# Patient Record
Sex: Female | Born: 2005 | Race: Black or African American | Hispanic: No | Marital: Single | Smoking: Never smoker
Health system: Southern US, Community
[De-identification: ages and names within clinical notes are randomized; demographics above are authoritative.]

## PROBLEM LIST (undated history)

## (undated) DIAGNOSIS — J45909 Unspecified asthma, uncomplicated: Secondary | ICD-10-CM

## (undated) DIAGNOSIS — L309 Dermatitis, unspecified: Secondary | ICD-10-CM

## (undated) HISTORY — DX: Dermatitis, unspecified: L30.9

## (undated) HISTORY — DX: Unspecified asthma, uncomplicated: J45.909

---
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
## 2023-01-14 ENCOUNTER — Other Ambulatory Visit: Payer: BC Managed Care – PPO

## 2023-01-14 DIAGNOSIS — E538 Deficiency of other specified B group vitamins: Secondary | ICD-10-CM

## 2023-01-14 MED ORDER — CYANOCOBALAMIN 1000 MCG/ML IJ SOLN
1000.0000 ug | Freq: Once | INTRAMUSCULAR | Status: AC
Start: 2023-01-14 — End: ?

## 2023-01-29 ENCOUNTER — Other Ambulatory Visit: Payer: Self-pay | Admitting: Allergy & Immunology

## 2023-02-11 ENCOUNTER — Other Ambulatory Visit (INDEPENDENT_AMBULATORY_CARE_PROVIDER_SITE_OTHER): Payer: BC Managed Care – PPO

## 2023-02-11 ENCOUNTER — Ambulatory Visit: Payer: BC Managed Care – PPO | Admitting: Allergy & Immunology

## 2023-02-11 DIAGNOSIS — E538 Deficiency of other specified B group vitamins: Secondary | ICD-10-CM | POA: Diagnosis not present

## 2023-02-11 MED ORDER — CYANOCOBALAMIN 1000 MCG/ML IJ SOLN
1000.0000 ug | Freq: Once | INTRAMUSCULAR | Status: AC
Start: 2023-02-11 — End: 2023-02-11
  Administered 2023-02-11: 1000 ug via INTRAMUSCULAR

## 2023-02-17 ENCOUNTER — Other Ambulatory Visit: Payer: Self-pay

## 2023-02-17 ENCOUNTER — Ambulatory Visit (INDEPENDENT_AMBULATORY_CARE_PROVIDER_SITE_OTHER): Payer: BC Managed Care – PPO | Admitting: Family Medicine

## 2023-02-17 ENCOUNTER — Encounter: Payer: Self-pay | Admitting: Family Medicine

## 2023-02-17 VITALS — BP 102/60 | HR 90 | Temp 98.2°F | Wt 255.0 lb

## 2023-02-17 DIAGNOSIS — K219 Gastro-esophageal reflux disease without esophagitis: Secondary | ICD-10-CM

## 2023-02-17 DIAGNOSIS — J3089 Other allergic rhinitis: Secondary | ICD-10-CM

## 2023-02-17 DIAGNOSIS — T7802XD Anaphylactic reaction due to shellfish (crustaceans), subsequent encounter: Secondary | ICD-10-CM | POA: Diagnosis not present

## 2023-02-17 DIAGNOSIS — T783XXD Angioneurotic edema, subsequent encounter: Secondary | ICD-10-CM

## 2023-02-17 DIAGNOSIS — J4540 Moderate persistent asthma, uncomplicated: Secondary | ICD-10-CM | POA: Diagnosis not present

## 2023-02-17 DIAGNOSIS — T783XXA Angioneurotic edema, initial encounter: Secondary | ICD-10-CM

## 2023-02-17 DIAGNOSIS — J302 Other seasonal allergic rhinitis: Secondary | ICD-10-CM

## 2023-02-17 MED ORDER — BUDESONIDE-FORMOTEROL FUMARATE 160-4.5 MCG/ACT IN AERO: INHALATION_SPRAY | RESPIRATORY_TRACT | 5 refills | Status: DC

## 2023-02-17 MED ORDER — FLUTICASONE PROPIONATE 50 MCG/ACT NA SUSP: 1.0000 | Freq: Every day | NASAL | 5 refills | Status: DC

## 2023-02-17 NOTE — Patient Instructions (Addendum)
Asthma Restart Symbicort 160-2 puffs once a day with a spacer to prevent cough or wheeze You may use albuterol 2 puffs once every 4 hours as needed for cough or wheeze Continue albuterol 2 puffs 5 to 15 minutes before activity to decrease cough or wheeze For asthma flare increase Symbicort 160 to 2 puffs twice a day for 2 weeks or until cough and wheeze free, then return to original dosing  Allergic rhinitis Continue allergen avoidance measures directed toward tree pollen, weed pollen, grass pollen, mold, dust mite, cat, and cockroach as listed below Continue cetirizine 10 mg once a day as needed for runny nose or itch Continue Flonase 2 sprays in each nostril once a day as needed for stuffy nose Consider saline nasal rinses as needed for nasal symptoms. Use this before any medicated nasal sprays for best result Consider allergen immunotherapy if your symptoms are not well-controlled with the treatment plan as listed below  Chronic urticaria/angioedema Continue cetirizine once or twice a day as needed for hives or itch If your symptoms re-occur, begin a journal of events that occurred for up to 6 hours before your symptoms began including foods and beverages consumed, soaps or perfumes you had contact with, and medications.   Reflux Continue dietary and lifestyle modifications as listed below  Food allergy Continue to avoid all shellfish. In case of an allergic reaction, take Benadryl 50 mg every 4 hours, and if life-threatening symptoms occur, inject with EpiPen 0.3 mg.  Call the clinic if this treatment plan is not working well for you  Follow up in 6 months or sooner if needed.  Reducing Pollen Exposure The American Academy of Allergy, Asthma and Immunology suggests the following steps to reduce your exposure to pollen during allergy seasons. Do not hang sheets or clothing out to dry; pollen may collect on these items. Do not mow lawns or spend time around freshly cut grass; mowing  stirs up pollen. Keep windows closed at night.  Keep car windows closed while driving. Minimize morning activities outdoors, a time when pollen counts are usually at their highest. Stay indoors as much as possible when pollen counts or humidity is high and on windy days when pollen tends to remain in the air longer. Use air conditioning when possible.  Many air conditioners have filters that trap the pollen spores. Use a HEPA room air filter to remove pollen form the indoor air you breathe.  Control of Mold Allergen Mold and fungi can grow on a variety of surfaces provided certain temperature and moisture conditions exist.  Outdoor molds grow on plants, decaying vegetation and soil.  The major outdoor mold, Alternaria and Cladosporium, are found in very high numbers during hot and dry conditions.  Generally, a late Summer - Fall peak is seen for common outdoor fungal spores.  Rain will temporarily lower outdoor mold spore count, but counts rise rapidly when the rainy period ends.  The most important indoor molds are Aspergillus and Penicillium.  Dark, humid and poorly ventilated basements are ideal sites for mold growth.  The next most common sites of mold growth are the bathroom and the kitchen.  Outdoor [employer] Use air conditioning and keep windows closed Avoid exposure to decaying vegetation. Avoid leaf raking. Avoid grain handling. Consider wearing a face mask if working in moldy areas.  Indoor Mold Control Maintain humidity below 50%. Clean washable surfaces with 5% bleach solution. Remove sources e.g. Contaminated carpets.  Control of Dog or Cat Allergen Avoidance is the  best way to manage a dog or cat allergy. If you have a dog or cat and are allergic to dog or cats, consider removing the dog or cat from the home. If you have a dog or cat but don't want to find it a new home, or if your family wants a pet even though someone in the household is allergic, here are some strategies  that may help keep symptoms at bay:  Keep the pet out of your bedroom and restrict it to only a few rooms. Be advised that keeping the dog or cat in only one room will not limit the allergens to that room. Don't pet, hug or kiss the dog or cat; if you do, wash your hands with soap and water. High-efficiency particulate air (HEPA) cleaners run continuously in a bedroom or living room can reduce allergen levels over time. Regular use of a high-efficiency vacuum cleaner or a central vacuum can reduce allergen levels. Giving your dog or cat a bath at least once a week can reduce airborne allergen.  Control of Cockroach Allergen Cockroach allergen has been identified as an important cause of acute attacks of asthma, especially in urban settings.  There are fifty-five species of cockroach that exist in the [place], however only three, the [place], [place] species produce allergen that can affect patients with Asthma.  Allergens can be obtained from fecal particles, egg casings and secretions from cockroaches.    Remove food sources. Reduce access to water. Seal access and entry points. Spray runways with 0.5-1% Diazinon or Chlorpyrifos Blow boric acid power under stoves and refrigerator. Place bait stations (hydramethylnon) at feeding sites.    Control of Dust Mite Allergen Dust mites play a major role in allergic asthma and rhinitis. They occur in environments with high humidity wherever human skin is found. Dust mites absorb humidity from the atmosphere (ie, they do not drink) and feed on organic matter (including shed human and animal skin). Dust mites are a microscopic type of insect that you cannot see with the naked eye. High levels of dust mites have been detected from mattresses, pillows, carpets, upholstered furniture, bed covers, clothes, soft toys and any woven material. The principal allergen of the dust mite is found in its feces. A gram of dust may contain 1,000 mites  and 250,000 fecal particles. Mite antigen is easily measured in the air during house cleaning activities. Dust mites do not bite and do not cause harm to humans, other than by triggering allergies/asthma.  Ways to decrease your exposure to dust mites in your home:  1. Encase mattresses, box springs and pillows with a mite-impermeable barrier or cover  2. Wash sheets, blankets and drapes weekly in hot water (130 F) with detergent and dry them in a dryer on the hot setting.  3. Have the room cleaned frequently with a vacuum cleaner and a damp dust-mop. For carpeting or rugs, vacuuming with a vacuum cleaner equipped with a high-efficiency particulate air (HEPA) filter. The dust mite allergic individual should not be in a room which is being cleaned and should wait 1 hour after cleaning before going into the room.  4. Do not sleep on upholstered furniture (eg, couches).  5. If possible removing carpeting, upholstered furniture and drapery from the home is ideal. Horizontal blinds should be eliminated in the rooms where the person spends the most time (bedroom, study, television room). Washable vinyl, roller-type shades are optimal.  6. Remove all non-washable stuffed toys from the  bedroom. Wash stuffed toys weekly like sheets and blankets above.  7. Reduce indoor humidity to less than 50%. Inexpensive humidity monitors can be purchased at most hardware stores. Do not use a humidifier as can make the problem worse and are not recommended.

## 2023-02-17 NOTE — Progress Notes (Signed)
[address] [place] [address] Dept: [phone]  FOLLOW UP NOTE  Patient ID: Dana Mcbride, female    DOB: [birth year]  Age: 17 y.o. MRN: [id number] Date of Office Visit: 02/17/2023  Assessment  Chief Complaint: Follow-up  HPI Dana Mcbride is a 17 year old female who presents to the clinic for a follow-up visit.  She was last seen in this clinic on 10/29/2022 by Dr. Dellis Anes for evaluation of asthma, allergic rhinitis, reflux, urticaria, and food allergy to shellfish.  She is accompanied by her mother who assists with history.  At today's visit, she reports her asthma has been moderately well-controlled with infrequent dry cough has been main symptom.  She reports that she has been taking Symbicort 160-2 puffs once a day and using albuterol infrequently, however, she has recently run out of Symbicort 160 and is using albuterol more frequently.  She has not taken montelukast over the last 4 months.  She reports montelukast does not provide relief from symptoms of asthma or allergy and is not interested in restarting montelukast at this time.  Allergic rhinitis is reported as moderately well-controlled with nasal symptoms including clear rhinorrhea and postnasal drainage as the main symptoms.  She continues Flonase infrequently, cetirizine as needed, and is not currently using saline nasal rinses. Her last environmental allergy skin testing was on 10/13/2017 and was positive to grass pollen, weed pollen, ragweed pollen, tree pollen, mold, cat, dog, dust mite, and cockroach.    Reflux is reported as well-controlled with no symptoms including heartburn or vomiting.  She is not currently taking a medication to control reflux.  She has previously taken Protonix, however, reports that she has not taken this medication over the last several months with no return of reflux symptoms.  She reports that she has not experienced any lip swelling since her last visit to this clinic.  She continues to  avoid shellfish with no accidental ingestion or EpiPen use since her last visit to this clinic.  Her last food allergy testing via lab was positive to shellfish on 09/12/2022.  EpiPen set is up-to-date.  Her current medications are listed in the chart.  Drug Allergies:  Allergies  Allergen Reactions   Other Hives   Shellfish Allergy Hives   Shellfish-Derived Products     Physical Exam: BP 102/60   Pulse 90   Temp 98.2 F (36.8 C) (Temporal)   Wt 255 lb (115.7 kg)   SpO2 97%   BMI 39.56 kg/m    Physical Exam Vitals reviewed.  Constitutional:      Appearance: Normal appearance.  HENT:     Head: Normocephalic and atraumatic.     Right Ear: Tympanic membrane normal.     Left Ear: Tympanic membrane normal.     Nose:     Comments: Bilateral nares slightly erythematous with thin clear nasal drainage noted.  Pharynx normal.  Ears normal.  Eyes normal.    Mouth/Throat:     Pharynx: Oropharynx is clear.  Eyes:     Conjunctiva/sclera: Conjunctivae normal.  Cardiovascular:     Rate and Rhythm: Normal rate and regular rhythm.     Heart sounds: Normal heart sounds. No murmur heard. Pulmonary:     Effort: Pulmonary effort is normal.     Breath sounds: Normal breath sounds.     Comments: Lungs clear to auscultation Musculoskeletal:        General: Normal range of motion.     Cervical back: Normal range of motion and neck supple.  Skin:    General: Skin is warm and dry.  Neurological:     Mental Status: She is alert and oriented to person, place, and time.  Psychiatric:        Mood and Affect: Mood normal.        Behavior: Behavior normal.        Thought Content: Thought content normal.        Judgment: Judgment normal.     Diagnostics: FVC 3.36 which is 93% of predicted value, FEV1 2.80 which is 88% of predicted value.  Spirometry indicates normal ventilatory function.  Assessment and Plan: 1. Moderate persistent asthma, uncomplicated   2. Seasonal and perennial allergic  rhinitis   3. Gastroesophageal reflux disease, unspecified whether esophagitis present   4. Anaphylactic shock due to shellfish, subsequent encounter   5. Angioedema, initial encounter     Meds ordered this encounter  Medications   fluticasone (FLONASE) 50 MCG/ACT nasal spray    Sig: Place 1 spray into both nostrils daily.    Dispense:  16 mL    Refill:  5   budesonide-formoterol (SYMBICORT) 160-4.5 MCG/ACT inhaler    Sig: INHALE 2 PUFFS INTO THE LUNGS TWICE A DAY    Dispense:  10.2 each    Refill:  5    Patient Instructions  Asthma Restart Symbicort 160-2 puffs once a day with a spacer to prevent cough or wheeze You may use albuterol 2 puffs once every 4 hours as needed for cough or wheeze Continue albuterol 2 puffs 5 to 15 minutes before activity to decrease cough or wheeze For asthma flare increase Symbicort 160 to 2 puffs twice a day for 2 weeks or until cough and wheeze free, then return to original dosing  Allergic rhinitis Continue allergen avoidance measures directed toward tree pollen, weed pollen, grass pollen, mold, dust mite, cat, and cockroach as listed below Continue cetirizine 10 mg once a day as needed for runny nose or itch Continue Flonase 2 sprays in each nostril once a day as needed for stuffy nose Consider saline nasal rinses as needed for nasal symptoms. Use this before any medicated nasal sprays for best result Consider allergen immunotherapy if your symptoms are not well-controlled with the treatment plan as listed below  Chronic urticaria/angioedema Continue cetirizine once or twice a day as needed for hives or itch If your symptoms re-occur, begin a journal of events that occurred for up to 6 hours before your symptoms began including foods and beverages consumed, soaps or perfumes you had contact with, and medications.   Reflux Continue dietary and lifestyle modifications as listed below  Food allergy Continue to avoid all shellfish. In case of an  allergic reaction, take Benadryl 50 mg every 4 hours, and if life-threatening symptoms occur, inject with EpiPen 0.3 mg.  Call the clinic if this treatment plan is not working well for you  Follow up in 6 months or sooner if needed.   Return in about 6 months (around 08/18/2023), or if symptoms worsen or fail to improve.    Thank you for the opportunity to care for this patient.  Please do not hesitate to contact me with questions.  Thermon Leyland, FNP [facility]

## 2023-03-04 ENCOUNTER — Other Ambulatory Visit (INDEPENDENT_AMBULATORY_CARE_PROVIDER_SITE_OTHER): Payer: BC Managed Care – PPO

## 2023-03-04 ENCOUNTER — Encounter: Payer: Self-pay | Admitting: Nurse Practitioner

## 2023-03-04 ENCOUNTER — Ambulatory Visit: Payer: BC Managed Care – PPO | Admitting: Nurse Practitioner

## 2023-03-04 VITALS — BP 124/78 | HR 96 | Temp 98.1°F | Wt 251.4 lb

## 2023-03-04 DIAGNOSIS — R110 Nausea: Secondary | ICD-10-CM | POA: Diagnosis not present

## 2023-03-04 DIAGNOSIS — J029 Acute pharyngitis, unspecified: Secondary | ICD-10-CM | POA: Diagnosis not present

## 2023-03-04 DIAGNOSIS — R197 Diarrhea, unspecified: Secondary | ICD-10-CM

## 2023-03-04 DIAGNOSIS — R059 Cough, unspecified: Secondary | ICD-10-CM

## 2023-03-04 LAB — POCT RAPID STREP A (OFFICE): Rapid Strep A Screen: NEGATIVE

## 2023-03-04 LAB — POC COVID19 BINAXNOW: SARS Coronavirus 2 Ag: NEGATIVE

## 2023-03-04 LAB — POCT INFLUENZA A/B
Influenza A, POC: NEGATIVE
Influenza B, POC: NEGATIVE

## 2023-03-04 MED ORDER — FLUCONAZOLE 150 MG PO TABS
ORAL_TABLET | ORAL | 2 refills | Status: DC
Start: 2023-03-04 — End: 2023-12-30

## 2023-03-04 MED ORDER — LIDOCAINE VISCOUS HCL 2 % MT SOLN
5.0000 mL | OROMUCOSAL | 0 refills | Status: DC | PRN
Start: 2023-03-04 — End: 2023-12-30

## 2023-03-04 MED ORDER — AMOXICILLIN-POT CLAVULANATE 875-125 MG PO TABS
1.0000 | ORAL_TABLET | Freq: Two times a day (BID) | ORAL | 0 refills | Status: DC
Start: 2023-03-04 — End: 2023-12-30

## 2023-03-04 MED ORDER — ONDANSETRON 4 MG PO TBDP
4.0000 mg | ORAL_TABLET | Freq: Three times a day (TID) | ORAL | 0 refills | Status: AC | PRN
Start: 2023-03-04 — End: ?

## 2023-03-04 NOTE — Assessment & Plan Note (Signed)
 Acute onset of sore throat, headache, nausea, diarrhea, and cough starting on Sunday. Throat swab for strep was negative, but symptoms are consistent with streptococcal pharyngitis. Suspected Dana Mcbride testing may not detect bacteria. Throat pain rated 8/10. No sinus pain, pressure, rhinorrhea, or otalgia. No vomiting. Able to drink but eating is difficult due to throat pain. No myalgia but has headaches. No chills or sweats. Allergic to shellfish and egg. Discussed rapid symptom improvement with antibiotics if strep. Advised rest and hydration. Explained soothing options: lozenges, sprays, warm tea, honey. Discussed alternating acetaminophen and ibuprofen  for pain, swelling, and fever. Informed about potential post-antibiotic yeast infections and offered prescription if needed. - Prescribe Augmentin : take one immediately, another before bed tonight, then one in the morning and one at bedtime starting tomorrow for five days - Prescribe lidocaine  gargle for throat pain - Prescribe Zofran  (dissolvable) for nausea - Recommend over-the-counter lozenges, sprays, warm tea, and honey for throat soothing - Recommend alternating acetaminophen and ibuprofen  every four hours for pain, swelling, and fever - Advise rest and hydration - Provide work note covering from yesterday through Thursday - Instruct to contact if not improved by end of antibiotic course

## 2023-03-04 NOTE — Patient Instructions (Addendum)
 I am going to treat you today for strep throat. Your symptoms are VERY consistent with strep and I am concerned that it may be too Verla Bryngelson for the in office test to pick it up. I don't want you to have to wait several days for treatment by sending out a swab.  I have sent in Augmentin . Take one immediately when you get it and the second before you go to bed tonight.  Starting tomorrow, take one in the morning and one at bedtime.   I have sent in a lidocaine  swish for you to gargle with. This can help with the sore throat. You can also use over the counter lozenges or sprays to help. You can also use warm tea and honey to help.   I have sent in ondansetron  to help with the nausea. This will dissolve in the mouth and can be used for nausea.   Be sure you are drinking plenty of fluids and rest as much as possible.   Alternate Tylenol 1000mg  and Ibuprofen  600-800mg  every 4 hours for the next 1-2 days to help with the inflammation, pain, and fever.

## 2023-03-04 NOTE — Progress Notes (Signed)
 Dana Mcbride Doing, DNP, AGNP-c [facility] [address] [place], [place] [address] [phone]   ACUTE VISIT- ESTABLISHED PATIENT  Blood pressure 124/78, pulse 96, temperature 98.1 F (36.7 C), weight 251 lb 6.4 oz (114 kg), SpO2 98%.  Subjective:  HPI Dana Mcbride is a 17 y.o. female presents to day for evaluation of acute concern(s).   Dana Mcbride presented with a constellation of symptoms that began on Sunday. She reported a sore throat, headache, nausea, diarrhea, and cough. The patient denied experiencing sinus pain, pressure in the face, runny nose, or ear pain. Despite a negative strep test, the patient's symptoms were consistent with strep throat, which she had not previously experienced.  The patient reported a throat pain intensity of 6 on a scale of 0 to 10, with 10 being the worst pain imaginable. She also reported headaches but denied any body aches. The patient had not vomited but was experiencing difficulty eating due to throat pain, managing only to consume a few crackers and fluids.  The patient had not worked since the day before the consultation due to her symptoms. She had attempted to schedule an appointment on that day but was unable to do so. The patient was scheduled to work the following day and then had a day off before working again on the weekend.  ROS negative except for what is listed in HPI. History, Medications, Surgery, SDOH, and Family History reviewed and updated as appropriate.  Objective:  Physical Exam Vitals and nursing note reviewed.  Constitutional:      Appearance: She is ill-appearing.  HENT:     Head: Normocephalic.     Nose: Nose normal.     Mouth/Throat:     Pharynx: Uvula midline. Posterior oropharyngeal erythema present.     Tonsils: Tonsillar exudate present. 2+ on the right. 2+ on the left.  Eyes:     Conjunctiva/sclera: Conjunctivae normal.  Cardiovascular:     Rate and Rhythm: Normal rate and regular rhythm.   Pulmonary:     Effort: Pulmonary effort is normal.     Breath sounds: Normal breath sounds.  Lymphadenopathy:     Cervical: Cervical adenopathy present.  Neurological:     Mental Status: She is alert.          Assessment & Plan:   Problem List Items Addressed This Visit     Acute sore throat - Primary   Acute onset of sore throat, headache, nausea, diarrhea, and cough starting on Sunday. Throat swab for strep was negative, but symptoms are consistent with streptococcal pharyngitis. Suspected Dana Mcbride testing may not detect bacteria. Throat pain rated 8/10. No sinus pain, pressure, rhinorrhea, or otalgia. No vomiting. Able to drink but eating is difficult due to throat pain. No myalgia but has headaches. No chills or sweats. Allergic to shellfish and egg. Discussed rapid symptom improvement with antibiotics if strep. Advised rest and hydration. Explained soothing options: lozenges, sprays, warm tea, honey. Discussed alternating acetaminophen and ibuprofen  for pain, swelling, and fever. Informed about potential post-antibiotic yeast infections and offered prescription if needed. - Prescribe Augmentin : take one immediately, another before bed tonight, then one in the morning and one at bedtime starting tomorrow for five days - Prescribe lidocaine  gargle for throat pain - Prescribe Zofran  (dissolvable) for nausea - Recommend over-the-counter lozenges, sprays, warm tea, and honey for throat soothing - Recommend alternating acetaminophen and ibuprofen  every four hours for pain, swelling, and fever - Advise rest and hydration - Provide work note covering from yesterday through  Thursday - Instruct to contact if not improved by end of antibiotic course      Relevant Medications   amoxicillin -clavulanate (AUGMENTIN ) 875-125 MG tablet   lidocaine  (XYLOCAINE ) 2 % solution   fluconazole  (DIFLUCAN ) 150 MG tablet   Nausea   Relevant Medications   ondansetron  (ZOFRAN -ODT) 4 MG disintegrating tablet    Diarrhea      Dana Baldonado E Brynlei Klausner, DNP, AGNP-c

## 2023-03-06 ENCOUNTER — Other Ambulatory Visit (INDEPENDENT_AMBULATORY_CARE_PROVIDER_SITE_OTHER): Payer: BC Managed Care – PPO

## 2023-03-06 DIAGNOSIS — E538 Deficiency of other specified B group vitamins: Secondary | ICD-10-CM

## 2023-03-06 MED ORDER — CYANOCOBALAMIN 1000 MCG/ML IJ SOLN
1000.0000 ug | Freq: Once | INTRAMUSCULAR | Status: AC
Start: 2023-03-06 — End: 2023-03-06
  Administered 2023-03-06: 1000 ug via INTRAMUSCULAR

## 2023-04-08 ENCOUNTER — Other Ambulatory Visit: Payer: BC Managed Care – PPO

## 2023-04-18 ENCOUNTER — Other Ambulatory Visit (INDEPENDENT_AMBULATORY_CARE_PROVIDER_SITE_OTHER): Payer: BC Managed Care – PPO

## 2023-04-18 ENCOUNTER — Encounter: Payer: Self-pay | Admitting: *Deleted

## 2023-04-18 DIAGNOSIS — E538 Deficiency of other specified B group vitamins: Secondary | ICD-10-CM | POA: Diagnosis not present

## 2023-04-18 MED ORDER — CYANOCOBALAMIN 1000 MCG/ML IJ SOLN
1000.0000 ug | Freq: Once | INTRAMUSCULAR | Status: AC
Start: 2023-04-18 — End: 2023-04-18
  Administered 2023-04-18: 1000 ug via INTRAMUSCULAR

## 2023-05-06 ENCOUNTER — Other Ambulatory Visit (INDEPENDENT_AMBULATORY_CARE_PROVIDER_SITE_OTHER)

## 2023-05-06 ENCOUNTER — Other Ambulatory Visit: Payer: BC Managed Care – PPO

## 2023-05-06 DIAGNOSIS — E538 Deficiency of other specified B group vitamins: Secondary | ICD-10-CM | POA: Diagnosis not present

## 2023-05-06 MED ORDER — CYANOCOBALAMIN 1000 MCG/ML IJ SOLN
1000.0000 ug | Freq: Once | INTRAMUSCULAR | Status: AC
Start: 2023-05-06 — End: 2023-05-06
  Administered 2023-05-06: 1000 ug via INTRAMUSCULAR

## 2023-05-08 ENCOUNTER — Other Ambulatory Visit: Payer: BC Managed Care – PPO

## 2023-06-12 ENCOUNTER — Other Ambulatory Visit (INDEPENDENT_AMBULATORY_CARE_PROVIDER_SITE_OTHER)

## 2023-06-12 DIAGNOSIS — E538 Deficiency of other specified B group vitamins: Secondary | ICD-10-CM | POA: Diagnosis not present

## 2023-06-12 MED ORDER — CYANOCOBALAMIN 1000 MCG/ML IJ SOLN
1000.0000 ug | Freq: Once | INTRAMUSCULAR | Status: AC
Start: 2023-06-12 — End: 2023-06-12
  Administered 2023-06-12: 1000 ug via INTRAMUSCULAR

## 2023-06-15 ENCOUNTER — Other Ambulatory Visit: Payer: Self-pay | Admitting: Nurse Practitioner

## 2023-06-15 DIAGNOSIS — E559 Vitamin D deficiency, unspecified: Secondary | ICD-10-CM

## 2023-07-03 ENCOUNTER — Other Ambulatory Visit

## 2023-07-03 DIAGNOSIS — E538 Deficiency of other specified B group vitamins: Secondary | ICD-10-CM

## 2023-07-03 MED ORDER — CYANOCOBALAMIN 1000 MCG/ML IJ SOLN
1000.0000 ug | Freq: Once | INTRAMUSCULAR | Status: AC
Start: 2023-07-03 — End: 2023-07-03
  Administered 2023-07-03: 1000 ug via INTRAMUSCULAR

## 2023-08-05 ENCOUNTER — Other Ambulatory Visit

## 2023-08-05 IMAGING — DX DG CHEST 1V PORT
1 series · 1 of 1 positions shown · non-contrast
Comparison: None.

CLINICAL DATA: Cough and chest pain

EXAM:
PORTABLE CHEST 1 VIEW

[chest]
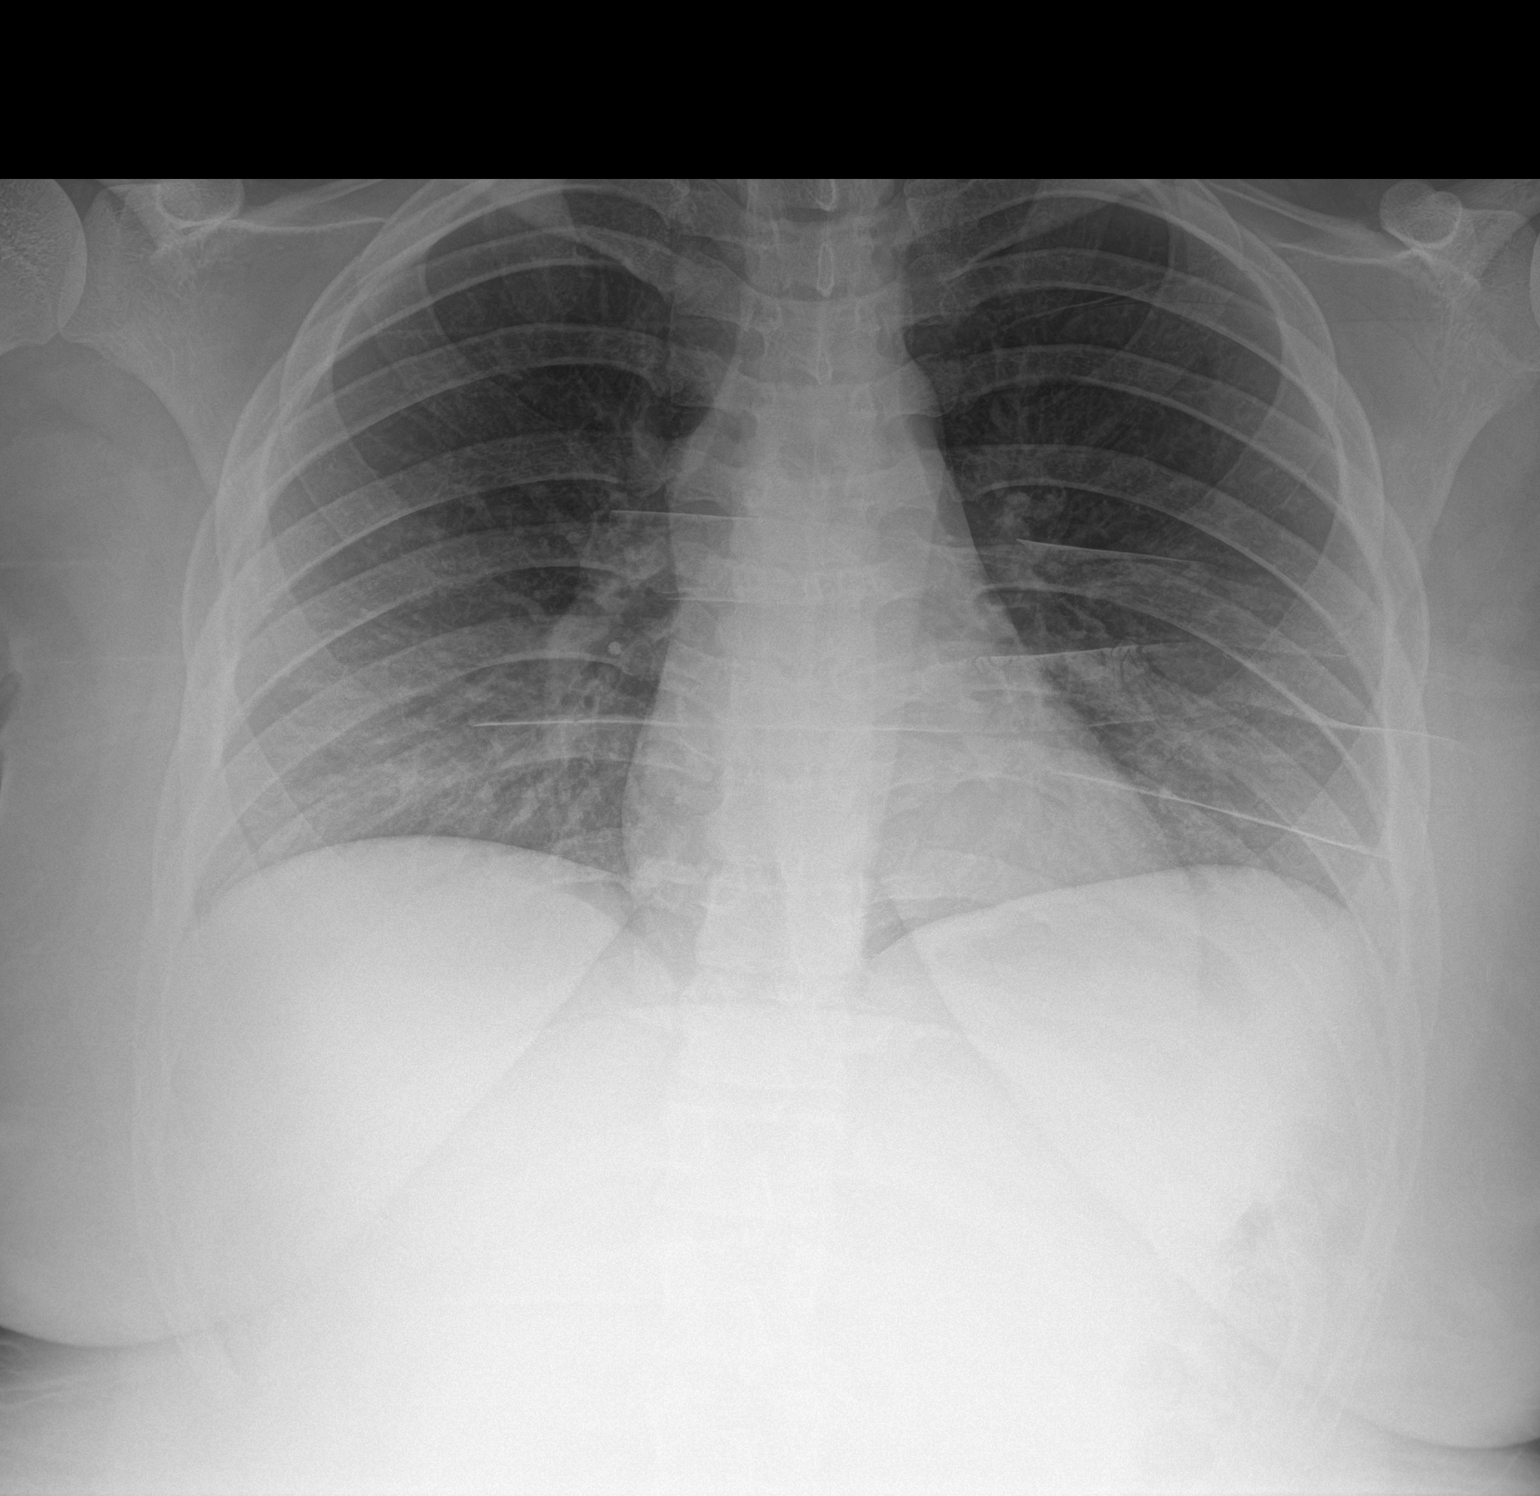

[1 of 1 positions shown; findings below may reference images not displayed]

FINDINGS: The heart size and mediastinal contours are within normal limits.
Both lungs are clear. The visualized skeletal structures are
unremarkable. Extrinsic artifact is noted over the mid and left
chest.
IMPRESSION: No active disease.

## 2023-08-21 ENCOUNTER — Ambulatory Visit: Payer: BC Managed Care – PPO | Admitting: Allergy & Immunology

## 2023-12-11 ENCOUNTER — Telehealth: Payer: Self-pay | Admitting: Internal Medicine

## 2023-12-11 NOTE — Telephone Encounter (Unsigned)
 Copied from CRM [id number]. Topic: Clinical - Request for Lab/Test Order >> Dec 10, 2023  4:46 PM [place] J wrote: Reason for CRM: Patient would like fasting labs ordered so she can schedule close to her physical date.   She would also like an order for a B12 injection.

## 2023-12-12 ENCOUNTER — Other Ambulatory Visit: Payer: Self-pay

## 2023-12-12 DIAGNOSIS — E538 Deficiency of other specified B group vitamins: Secondary | ICD-10-CM

## 2023-12-12 DIAGNOSIS — Z0000 Encounter for general adult medical examination without abnormal findings: Secondary | ICD-10-CM

## 2023-12-12 DIAGNOSIS — E559 Vitamin D deficiency, unspecified: Secondary | ICD-10-CM

## 2023-12-12 NOTE — Telephone Encounter (Signed)
Tried to call patient but phone just rang and rang 

## 2023-12-25 ENCOUNTER — Other Ambulatory Visit

## 2023-12-25 DIAGNOSIS — E559 Vitamin D deficiency, unspecified: Secondary | ICD-10-CM

## 2023-12-25 DIAGNOSIS — Z0000 Encounter for general adult medical examination without abnormal findings: Secondary | ICD-10-CM

## 2023-12-25 DIAGNOSIS — E538 Deficiency of other specified B group vitamins: Secondary | ICD-10-CM

## 2023-12-26 LAB — CMP14+EGFR
ALT: 15 IU/L (ref 0–32)
AST: 12 IU/L (ref 0–40)
Albumin: 4.3 g/dL (ref 4.0–5.0)
Alkaline Phosphatase: 67 IU/L (ref 42–106)
BUN/Creatinine Ratio: 14 (ref 9–23)
BUN: 11 mg/dL (ref 6–20)
Bilirubin Total: <0.2 mg/dL (ref 0.0–1.2)
CO2: 21 mmol/L (ref 20–29)
Calcium: 9 mg/dL (ref 8.7–10.2)
Chloride: 103 mmol/L (ref 96–106)
Creatinine, Ser: 0.8 mg/dL (ref 0.57–1.00)
Globulin, Total: 2.8 g/dL (ref 1.5–4.5)
Glucose: 97 mg/dL (ref 70–99)
Potassium: 3.7 mmol/L (ref 3.5–5.2)
Sodium: 137 mmol/L (ref 134–144)
Total Protein: 7.1 g/dL (ref 6.0–8.5)
eGFR: 108 mL/min/1.73 (ref >59)

## 2023-12-26 LAB — VITAMIN D 25 HYDROXY (VIT D DEFICIENCY, FRACTURES): Vit D, 25-Hydroxy: 25.2 ng/mL — ABNORMAL LOW (ref 30.0–100.0)

## 2023-12-26 LAB — CBC WITH DIFFERENTIAL/PLATELET
Basophils Absolute: 0 x10E3/uL (ref 0.0–0.2)
Basos: 0 %
EOS (ABSOLUTE): 0.1 x10E3/uL (ref 0.0–0.4)
Eos: 2 %
Hematocrit: 40.9 % (ref 34.0–46.6)
Hemoglobin: 12.2 g/dL (ref 11.1–15.9)
Immature Grans (Abs): 0 x10E3/uL (ref 0.0–0.1)
Immature Granulocytes: 0 %
Lymphocytes Absolute: 3 x10E3/uL (ref 0.7–3.1)
Lymphs: 39 %
MCH: 22.9 pg — ABNORMAL LOW (ref 26.6–33.0)
MCHC: 29.8 g/dL — ABNORMAL LOW (ref 31.5–35.7)
MCV: 77 fL — ABNORMAL LOW (ref 79–97)
Monocytes Absolute: 0.5 x10E3/uL (ref 0.1–0.9)
Monocytes: 6 %
Neutrophils Absolute: 4.1 x10E3/uL (ref 1.4–7.0)
Neutrophils: 53 %
Platelets: 350 x10E3/uL (ref 150–450)
RBC: 5.33 x10E6/uL — ABNORMAL HIGH (ref 3.77–5.28)
RDW: 14 % (ref 11.7–15.4)
WBC: 7.8 x10E3/uL (ref 3.4–10.8)

## 2023-12-26 LAB — LIPID PANEL
Chol/HDL Ratio: 3.5 ratio (ref 0.0–4.4)
Cholesterol, Total: 143 mg/dL (ref 100–199)
HDL: 41 mg/dL (ref >39)
LDL Chol Calc (NIH): 86 mg/dL (ref 0–99)
Triglycerides: 80 mg/dL (ref 0–149)
VLDL Cholesterol Cal: 16 mg/dL (ref 5–40)

## 2023-12-26 LAB — HEMOGLOBIN A1C
Est. average glucose Bld gHb Est-mCnc: 111 mg/dL
Hgb A1c MFr Bld: 5.5 % (ref 4.8–5.6)

## 2023-12-26 LAB — TSH: TSH: 1.59 u[IU]/mL (ref 0.450–4.500)

## 2023-12-26 LAB — VITAMIN B12: Vitamin B-12: 438 pg/mL (ref 232–1245)

## 2023-12-30 ENCOUNTER — Ambulatory Visit: Admitting: Allergy & Immunology

## 2023-12-30 ENCOUNTER — Encounter: Payer: Self-pay | Admitting: Allergy & Immunology

## 2023-12-30 ENCOUNTER — Other Ambulatory Visit: Payer: Self-pay

## 2023-12-30 VITALS — BP 130/80 | HR 78 | Temp 98.1°F | Resp 19 | Ht 68.11 in | Wt 246.9 lb

## 2023-12-30 DIAGNOSIS — K219 Gastro-esophageal reflux disease without esophagitis: Secondary | ICD-10-CM

## 2023-12-30 DIAGNOSIS — J4540 Moderate persistent asthma, uncomplicated: Secondary | ICD-10-CM

## 2023-12-30 DIAGNOSIS — J3089 Other allergic rhinitis: Secondary | ICD-10-CM

## 2023-12-30 DIAGNOSIS — T7802XD Anaphylactic reaction due to shellfish (crustaceans), subsequent encounter: Secondary | ICD-10-CM | POA: Diagnosis not present

## 2023-12-30 DIAGNOSIS — J302 Other seasonal allergic rhinitis: Secondary | ICD-10-CM

## 2023-12-30 MED ORDER — FLUTICASONE PROPIONATE 50 MCG/ACT NA SUSP: 1.0000 | Freq: Every day | NASAL | 5 refills | Status: AC

## 2023-12-30 MED ORDER — ALBUTEROL SULFATE HFA 108 (90 BASE) MCG/ACT IN AERS: 2.0000 | INHALATION_SPRAY | RESPIRATORY_TRACT | 1 refills | Status: DC | PRN

## 2023-12-30 MED ORDER — BUDESONIDE-FORMOTEROL FUMARATE 160-4.5 MCG/ACT IN AERO: INHALATION_SPRAY | RESPIRATORY_TRACT | 5 refills | Status: AC

## 2023-12-30 MED ORDER — NEFFY 2 MG/0.1ML NA SOLN: 2.0000 mg | Freq: Every day | NASAL | 1 refills | Status: AC | PRN

## 2023-12-30 MED ORDER — ALBUTEROL SULFATE (2.5 MG/3ML) 0.083% IN NEBU: 2.5000 mg | INHALATION_SOLUTION | RESPIRATORY_TRACT | 0 refills | Status: AC | PRN

## 2023-12-30 NOTE — Patient Instructions (Addendum)
 1. Anaphylactic shock due to shellfish - Continue to avoid shellfish. - Neffy  demonstration provided today.  - Cal us  if this is too expensive.   2. Chronic urticaria - These seems to be under good control.  - Continue with the Zyrtec  1-2 times daily AS NEEDED.  - Continue with montelukast  10 mg daily.  3. Seasonal and perennial allergic rhinitis (trees, weeds, grasses, indoor molds, dust mites, cat and cockroach) - Continue with: Zyrtec  (cetirizine ) 10mg  tablet once daily as needed and Flonase  (fluticasone ) two sprays per nostril daily as needed.  - You can use an extra dose of the antihistamine, if needed, for breakthrough symptoms.  - Allergy shots are an option for long-term control.  4. Mild persistent asthma, uncomplicated - Lung testing looks a bit lower.  - We will send in a refill for the Symbicort .  - Daily controller medication(s): Symbicort  160/4.80mcg ONE PUFF EVERY MORNING - Prior to physical activity: Symbicort  160/4.53mcg 10-15 minutes before physical activity - Rescue medications: Symbicort  160/4.41mcg one puff every 4 hours - Asthma control goals:  * Full participation in all desired activities (may need al buterol before activity) * Albuterol  use two time or less a week on average (not counting use with activity) * Cough interfering with sleep two time or less a month * Oral steroids no more than once a year * No hospitalizations  5. GERD - Continue with pantoprazole  40mg  once daily (either in the morning or the evening).  6. Return in about 6 months (around 06/29/2024).    Please inform us  of any Emergency Department visits, hospitalizations, or changes in symptoms. Call us  before going to the ED for breathing or allergy symptoms since we might be able to fit you in for a sick visit. Feel free to contact us  anytime with any questions, problems, or concerns.  It was a pleasure to see you and your family again today!  Websites that have reliable patient  information: 1. American Academy of Asthma, Allergy, and Immunology: www.aaaai.org 2. Food Allergy Research and Education (FARE): foodallergy.org 3. Mothers of Asthmatics: http://www.asthmacommunitynetwork.org 4. American College of Allergy, Asthma, and Immunology: www.acaai.org   COVID-19 Vaccine Information can be found at: podexchange.nl For questions related to vaccine distribution or appointments, please email [email or web address] or call [phone].     "Like" us  on Facebook and Instagram for our latest updates!        Make sure you are registered to vote! If you have moved or changed any of your contact information, you will need to get this updated before voting!  In some cases, you MAY be able to register to vote online: [email or web address]

## 2023-12-30 NOTE — Progress Notes (Signed)
 FOLLOW UP  Date of Service/Encounter:  12/30/23   Assessment:   Seasonal and perennial allergic rhinitis (trees, weeds, grasses, indoor molds, dust mites, cat and cockroach)  Moderate persistent asthma, uncomplicated   Gastroesophageal reflux disease  Anaphylactic shock due to shellfish  Plan/Recommendations:   1. Anaphylactic shock due to shellfish - Continue to avoid shellfish. - Neffy  demonstration provided today.  - Cal us  if this is too expensive.   2. Chronic urticaria - These seems to be under good control.  - Continue with the Zyrtec  1-2 times daily AS NEEDED.  - Continue with montelukast  10 mg daily.  3. Seasonal and perennial allergic rhinitis (trees, weeds, grasses, indoor molds, dust mites, cat and cockroach) - Continue with: Zyrtec  (cetirizine ) 10mg  tablet once daily as needed and Flonase  (fluticasone ) two sprays per nostril daily as needed.  - You can use an extra dose of the antihistamine, if needed, for breakthrough symptoms.  - Allergy shots are an option for long-term control.  4. Mild persistent asthma, uncomplicated - Lung testing looks a bit lower.  - We will send in a refill for the Symbicort .  - Daily controller medication(s): Symbicort  160/4.58mcg ONE PUFF EVERY MORNING - Prior to physical activity: Symbicort  160/4.79mcg 10-15 minutes before physical activity - Rescue medications: Symbicort  160/4.96mcg one puff every 4 hours - Asthma control goals:  * Full participation in all desired activities (may need al buterol before activity) * Albuterol  use two time or less a week on average (not counting use with activity) * Cough interfering with sleep two time or less a month * Oral steroids no more than once a year * No hospitalizations  5. GERD - Continue with pantoprazole  40mg  once daily (either in the morning or the evening).  6. Return in about 6 months (around 06/29/2024).   Subjective:   Dana Mcbride is a 18 y.o. female presenting today  for follow up of  Chief Complaint  Patient presents with   Follow-up    Dana Mcbride has a history of the following: Patient Active Problem List   Diagnosis Date Noted   Acute sore throat 03/04/2023   Nausea 03/04/2023   Diarrhea 03/04/2023   Angio-edema 09/13/2022   Encounter for annual physical exam 08/26/2022   Gastroesophageal reflux disease 07/21/2022   Severe dysmenorrhea 07/21/2022   BMI 39.0-39.9,adult 07/21/2022   Dizziness on standing 07/21/2022   Vitamin D  deficiency 07/21/2022   Deficiency of vitamin B12 07/21/2022   Moderate persistent asthma, uncomplicated 04/17/2022   Chronic urticaria 04/17/2022   ADHD 06/06/2021   Acne vulgaris 03/24/2021   Anaphylactic shock due to shellfish 10/13/2017   Seasonal and perennial allergic rhinitis 10/13/2017   Mild intermittent asthma, uncomplicated 10/13/2017   Abnormal auditory perception of right ear 02/29/2016    History obtained from: chart review and patient and mother.  Discussed the use of AI scribe software for clinical note transcription with the patient and/or guardian, who gave verbal consent to proceed.  Dana Mcbride is a 18 y.o. female presenting for a follow up visit.  She was last seen in December 2024 by Arlean Mutter, one of our esteemed nurse practitioners.  At that time, she was restarted on Symbicort  160 mcg 2 puffs twice daily as well as as needed.  For her allergic rhinitis, she was continued on cetirizine  as well as Flonase .  Allergy shots were discussed for long-term control.  She was having some hives that were under good control with antihistamines.  She continued to avoid shellfish.  Since last visit, she has been so so.   Asthma/Respiratory Symptom History: The patient reports her asthma has been pretty good with her current medication regimen, including Symbicort , which is nearing expiration. She has not required hospital or urgent care visits for breathing issues recently. She does not frequently use her  nebulizer, and her current supply has expired.  Allergic Rhinitis Symptom History: She has rhinorrhea very often.  She does not really use her Flonase  regularly, but she is interested in a refill.  She typically has nasal congestion in the mornings.   Food Allergy Symptom History: She has a shellfish allergy and has been avoiding shellfish entirely, preventing any recent allergic reactions. She has had one reaction in the past that required the use of an EpiPen . She currently uses an EpiPen  for emergencies.  Skin Symptom History: Her hives have not been problematic recently, with the last occurrence being several months ago. She is not taking any daily medication for hives.  GERD Symptom History: No issues with heartburn and does not require a refill for her heartburn medication. She uses Flonase  for nasal symptoms and requires a refill.  She is currently working at [employer] but is seeking a different job outside [employer].   Otherwise, there have been no changes to her past medical history, surgical history, family history, or social history.    Review of systems otherwise negative other than that mentioned in the HPI.    Objective:   Blood pressure 130/80, pulse 78, temperature 98.1 F (36.7 C), temperature source Temporal, resp. rate 19, height 5' 8.11 (1.73 m), weight 246 lb 14.4 oz (112 kg), SpO2 98%. Body mass index is 37.42 kg/m.    Physical Exam Vitals reviewed.  Constitutional:      Appearance: She is well-developed.     Comments: Very pleasant, more quiet than normal.  HENT:     Head: Normocephalic and atraumatic.     Right Ear: Tympanic membrane, ear canal and external ear normal.     Left Ear: Tympanic membrane, ear canal and external ear normal.     Nose: Rhinorrhea present. No nasal deformity or septal deviation.     Right Turbinates: Enlarged, swollen and pale.     Left Turbinates: Enlarged, swollen and pale.     Right Sinus: No maxillary sinus  tenderness or frontal sinus tenderness.     Left Sinus: No maxillary sinus tenderness or frontal sinus tenderness.     Comments: No nasal polyps.  No sinus tenderness.    Mouth/Throat:     Mouth: Mucous membranes are not pale and not dry.     Pharynx: Uvula midline.  Eyes:     General: Lids are normal. Allergic shiner present.        Right eye: No discharge.        Left eye: No discharge.     Conjunctiva/sclera: Conjunctivae normal.     Right eye: Right conjunctiva is not injected. No chemosis.    Left eye: Left conjunctiva is not injected. No chemosis.    Pupils: Pupils are equal, round, and reactive to light.  Cardiovascular:     Rate and Rhythm: Normal rate and regular rhythm.     Heart sounds: Normal heart sounds.  Pulmonary:     Effort: Pulmonary effort is normal. No tachypnea, accessory muscle usage or respiratory distress.     Breath sounds: Normal breath sounds. No decreased air movement or transmitted upper airway sounds. No decreased breath sounds, wheezing, rhonchi or  rales.  Chest:     Chest wall: No tenderness.  Lymphadenopathy:     Cervical: No cervical adenopathy.  Skin:    General: Skin is warm.     Capillary Refill: Capillary refill takes less than 2 seconds.     Coloration: Skin is not pale.     Findings: No abrasion, erythema, petechiae or rash. Rash is not papular, urticarial or vesicular.     Comments: Acne over the bilateral cheeks.    Neurological:     Mental Status: She is alert.  Psychiatric:        Behavior: Behavior is cooperative.      Diagnostic studies:    Spirometry: results normal (FEV1: 2.57/79%, FVC: 3.12/84%, FEV1/FVC: 82%).    Spirometry consistent with normal pattern. This is lower than that obtained at the last visit.   Allergy Studies: none        Marty Shaggy, MD  [facility] 

## 2023-12-31 ENCOUNTER — Ambulatory Visit: Payer: Self-pay | Admitting: Nurse Practitioner

## 2024-01-02 ENCOUNTER — Other Ambulatory Visit: Payer: Self-pay

## 2024-01-02 DIAGNOSIS — R0683 Snoring: Secondary | ICD-10-CM

## 2024-01-27 ENCOUNTER — Encounter: Payer: Self-pay | Admitting: Nurse Practitioner

## 2024-04-05 ENCOUNTER — Other Ambulatory Visit: Payer: Self-pay | Admitting: Allergy & Immunology

## 2024-05-07 ENCOUNTER — Encounter: Payer: Self-pay | Admitting: Nurse Practitioner

## 2024-06-29 ENCOUNTER — Ambulatory Visit: Admitting: Allergy & Immunology
